# Patient Record
Sex: Female | Born: 2000 | Smoking: Never smoker
Health system: Southern US, Community
[De-identification: ages and names within clinical notes are randomized; demographics above are authoritative.]

## PROBLEM LIST (undated history)

## (undated) DIAGNOSIS — Z8489 Family history of other specified conditions: Secondary | ICD-10-CM

## (undated) DIAGNOSIS — H539 Unspecified visual disturbance: Secondary | ICD-10-CM

## (undated) DIAGNOSIS — S83519A Sprain of anterior cruciate ligament of unspecified knee, initial encounter: Secondary | ICD-10-CM

## (undated) DIAGNOSIS — S0990XA Unspecified injury of head, initial encounter: Secondary | ICD-10-CM

---
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
## 2019-09-23 ENCOUNTER — Other Ambulatory Visit: Payer: Self-pay

## 2019-09-23 ENCOUNTER — Ambulatory Visit (INDEPENDENT_AMBULATORY_CARE_PROVIDER_SITE_OTHER): Payer: Medicaid Other | Admitting: Orthopedic Surgery

## 2019-09-23 ENCOUNTER — Ambulatory Visit: Payer: Self-pay

## 2019-09-23 DIAGNOSIS — M25561 Pain in right knee: Secondary | ICD-10-CM | POA: Diagnosis not present

## 2019-09-23 IMAGING — MR MR KNEE*R* W/O CM
4 of 6 series · 19 of 40 positions shown · non-contrast
Comparison: Right knee radiographs 08/31/2017

CLINICAL DATA: Injured knee playing basketball 3 weeks ago.
Persistent knee pain and swelling. Instability.

EXAM:
MRI OF THE RIGHT KNEE WITHOUT CONTRAST
TECHNIQUE: Multiplanar, multisequence MR imaging of the knee was performed. No
intravenous contrast was administered.

[Series 3: PD fat-sat · axial · 4.0mm · 0.31mm/px · z∈[-39,+66]mm · 8 of 25 slices shown (1 of 4)]
[im 1/25]
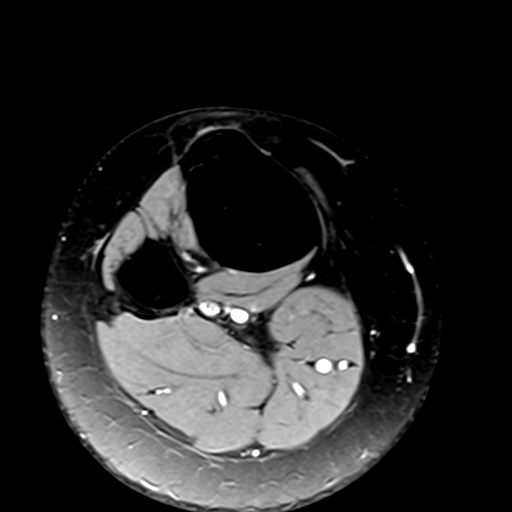
[im 4/25]
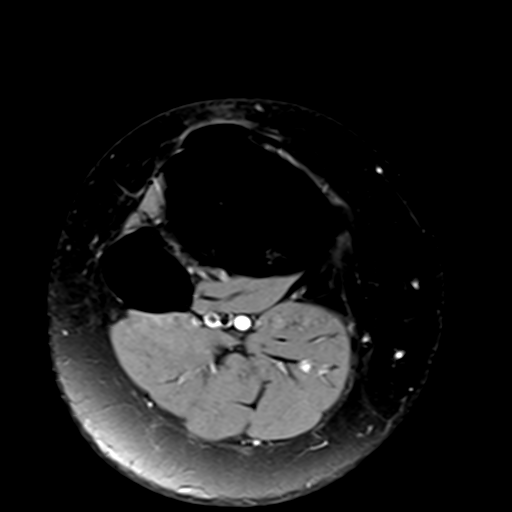
[im 7/25]
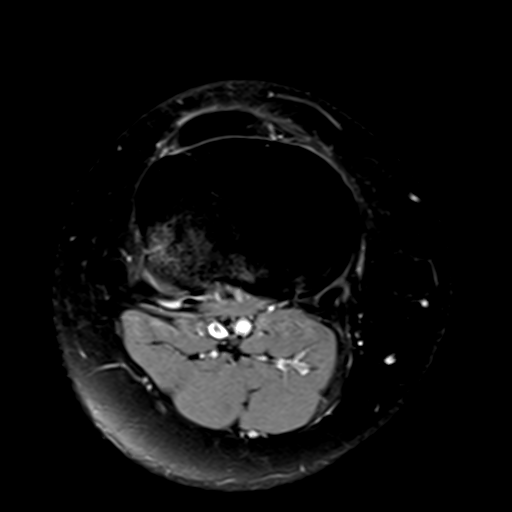
[im 11/25]
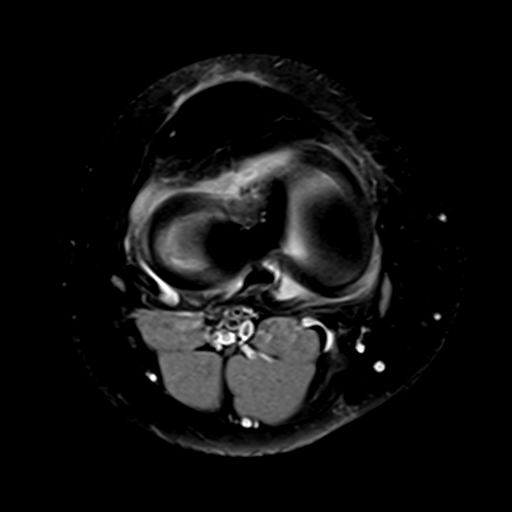
[im 14/25]
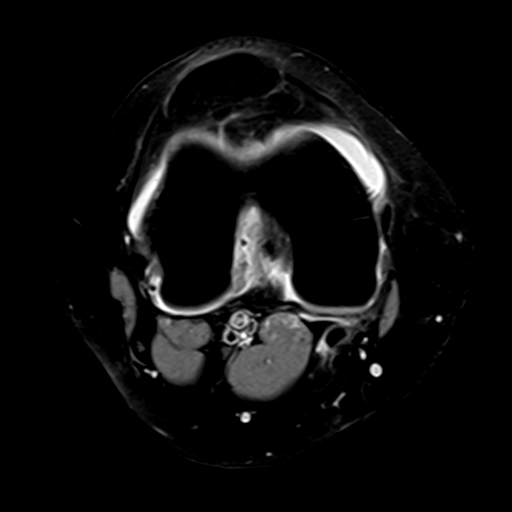
[im 18/25]
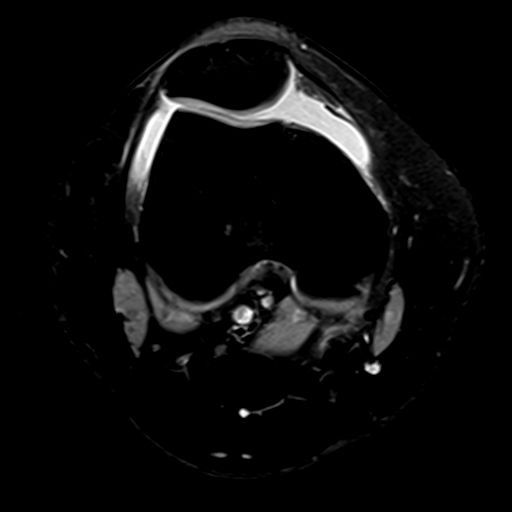
[im 21/25]
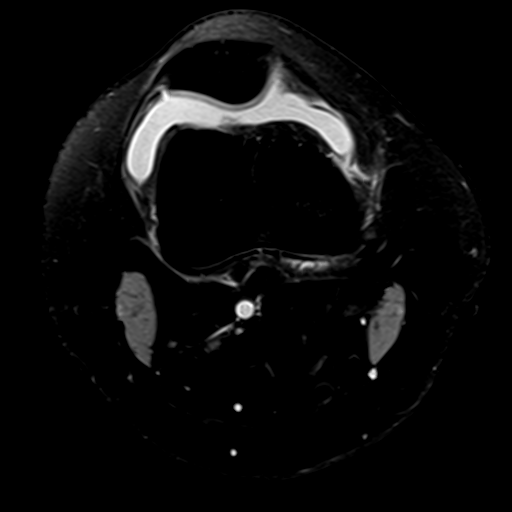
[im 25/25]
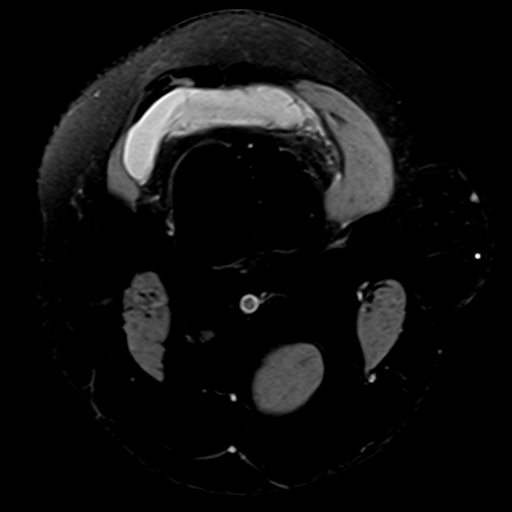

[Series 5: PD fat-sat · sagittal · 3.5mm · 0.31mm/px · 5 of 26 slices shown (2 of 4)]
[im 1/26]
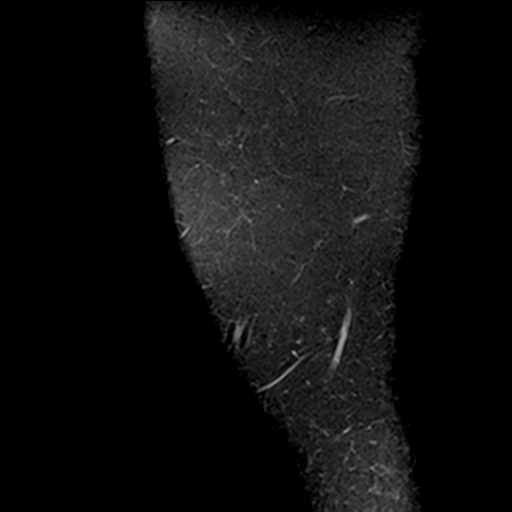
[im 4/26]
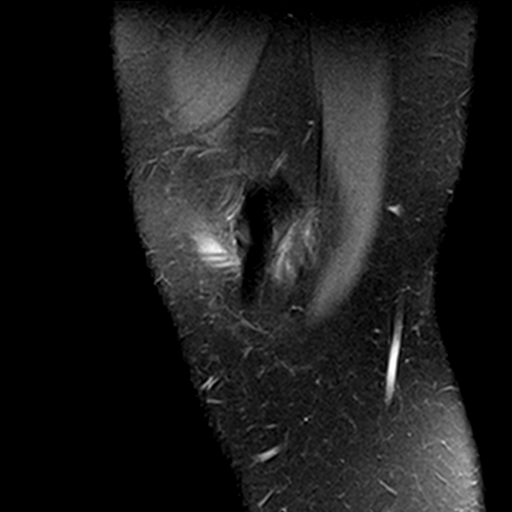
[im 8/26]
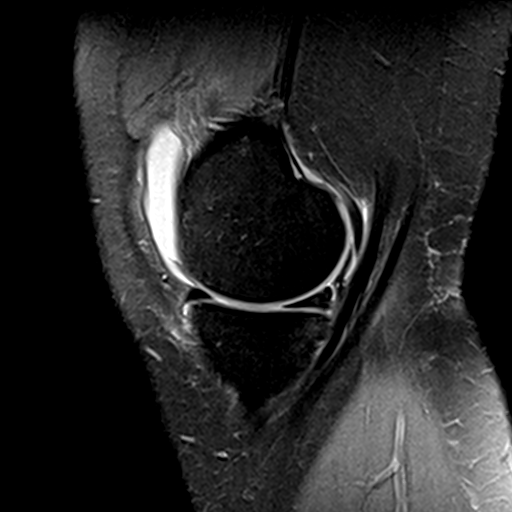
[im 15/26]
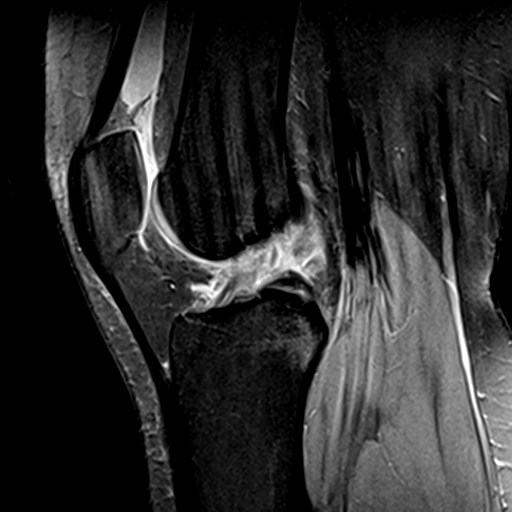
[im 22/26]
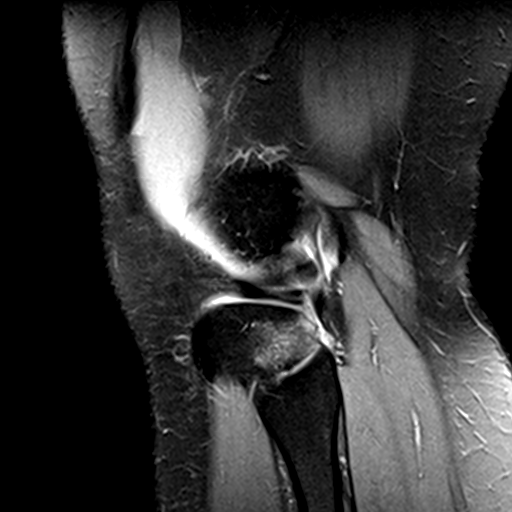

[Series 7: PD fat-sat · coronal · 3.5mm · 0.31mm/px · 3 of 25 slices shown (3 of 4)]
[im 5/25]
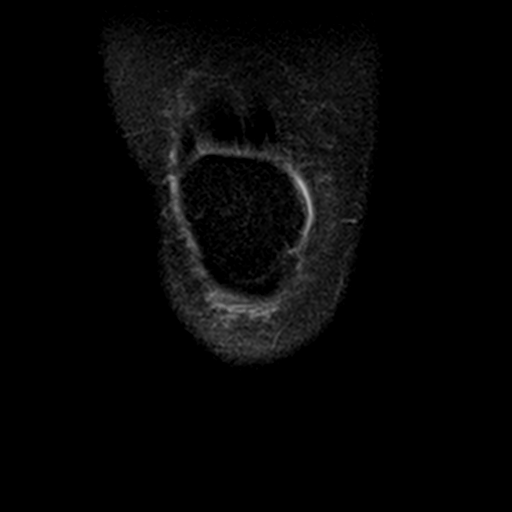
[im 13/25]
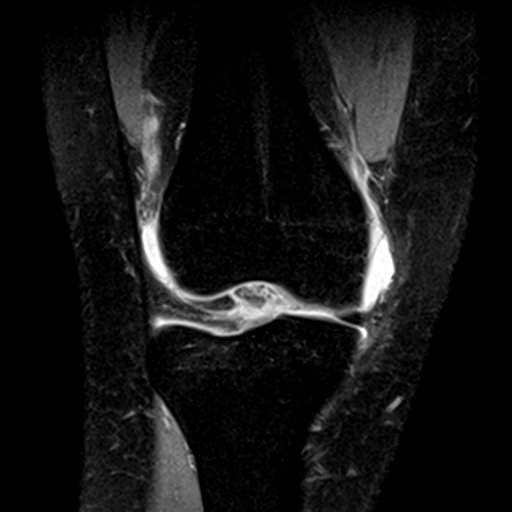
[im 21/25]
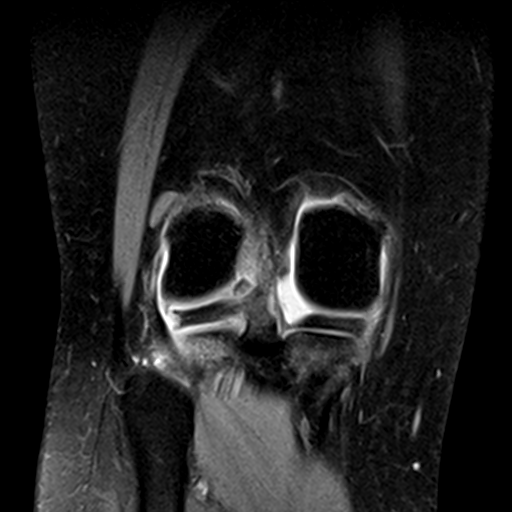

[Series 8: PD fat-sat · oblique · 2.0mm · 0.29mm/px · 3 of 11 slices shown (4 of 4)]
[im 1/11]
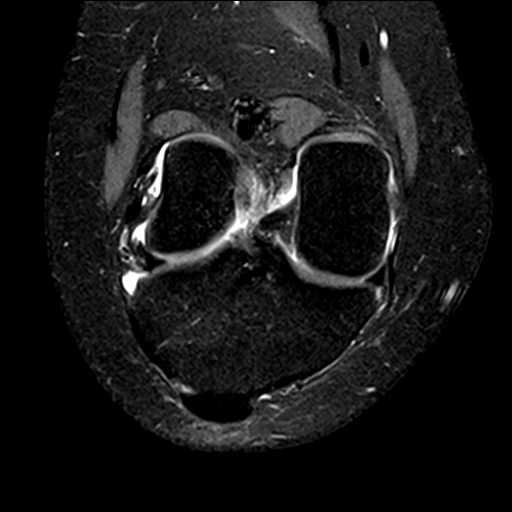
[im 6/11]
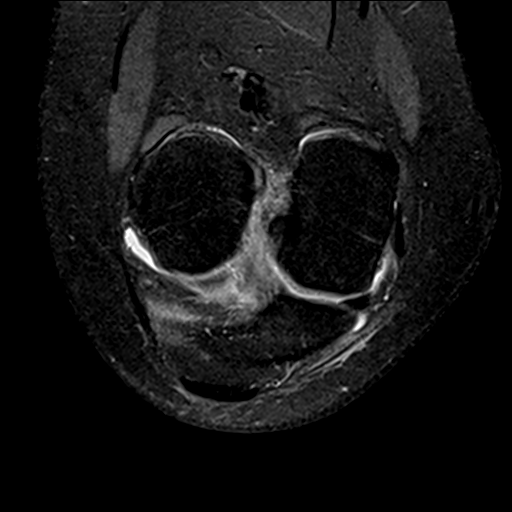
[im 11/11]
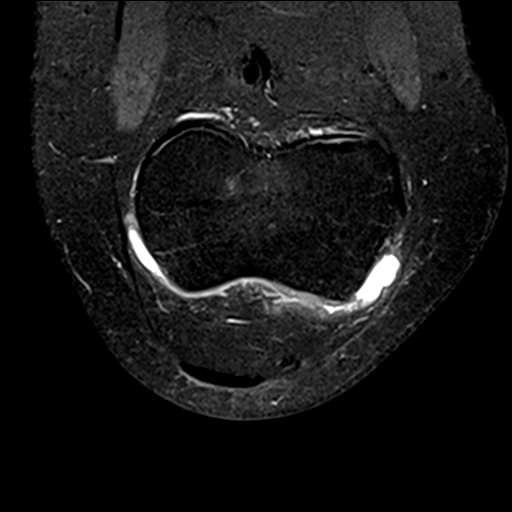

[19 of 40 positions shown; findings below may reference images not displayed]

FINDINGS: MENISCI

Medial meniscus: Meniscal contusion involving the posterior horn and
suspected meniscocapsular injury.

Lateral meniscus: Inferior articular surface tears, small surface
tears and horizontal cleavage-type tear involving the posterior
horn.

LIGAMENTS

Cruciates:  Complete midsubstance ACL rupture.  The PCL is intact.

Collaterals:  Intact

CARTILAGE

Patellofemoral:  Normal

Medial:  Normal

Lateral:  Normal

Joint:  Large joint effusion.  Superior and medial patellar plica.

Popliteal Fossa:  No popliteal mass.  Small Baker's cyst.

Extensor Mechanism: The patella retinacular structures are intact
and the quadriceps and patellar tendons are intact.

Bones:  Posterior tibial bone contusions without definite fracture.

Other: Normal knee musculature.
IMPRESSION: 1. Complete midsubstance ACL rupture and associated typical
pivot-shift bone contusions. The PCL and collateral ligaments are
intact.
2. Posterior horn lateral meniscal tears.
3. Meniscal contusion and suspected meniscocapsular injury involving
the posterior horn of the medial meniscus.
4. Large joint effusion with superior and medial patellar plica.
5. Intact articular cartilage.

## 2019-09-25 ENCOUNTER — Encounter: Payer: Self-pay | Admitting: Orthopedic Surgery

## 2019-09-25 NOTE — Progress Notes (Signed)
Office Visit Note   Patient: Kaitlyn Harris           Date of Birth: 2001-02-05           MRN: [id number] Visit Date: 09/23/2019 Requested by: Rubbie Battiest, NP [address],  [place] [address] PCP: Rubbie Battiest, NP  Subjective: Chief Complaint  Patient presents with  . Right Knee - Pain    HPI: Kaitlyn Harris is a 18 y.o. female who presents to the office complaining of right knee pain.  Patient returns to the office following right knee ACL reconstruction with hamstring autograft and meniscal debridement on 12/03/2017.  She notes 2 months of right knee pain that she localizes to the medial aspect of the right knee.  She states that it is not severe until she has to work where she works as a Multimedia programmer at [employer].  She notes that being on her feet standing for long periods of time makes her pain worse.  She has no pain today in the office.  She has been off work for several days.  She has been trying to use a brace without relief.  She denies any specific injury.  She denies any mechanical symptoms and has been using ibuprofen occasionally with some relief.  She has not been working out at the gym ever since her symptom onset.  She denies any significant back pain, weakness, numbness tingling, groin pain..                ROS:  All systems reviewed are negative as they relate to the chief complaint within the history of present illness.  Patient denies fevers or chills.  Assessment & Plan: Visit Diagnoses:  1. Right knee pain, unspecified chronicity     Plan: Patient is a 18 year old female who presents complaining of right knee pain.  Based on history and physical exam, impression is stress reaction.  X-rays of the right knee are negative for any significant pathology to explain her pain.  Will check vitamin D level today in clinic.  Will call with vitamin D level.  If it is low plan for vitamin D supplementation with return  office visit after several weeks.  However if her vitamin D is normal, will consider advanced imaging of the right knee.  Patient agrees with the plan.  Differential diagnosis would be stress fracture or stress reaction versus occult arthritis from meniscal resection.  Graft is stable no effusion in the knee.  Follow-Up Instructions: No follow-ups on file.   Orders:  Orders Placed This Encounter  Procedures  . XR KNEE 3 VIEW RIGHT  . Vitamin D (25 hydroxy)   No orders of the defined types were placed in this encounter.     Procedures: No procedures performed   Clinical Data: No additional findings.  Objective: Vital Signs: There were no vitals taken for this visit.  Physical Exam:  Constitutional: Patient appears well-developed HEENT:  Head: Normocephalic Eyes:EOM are normal Neck: Normal range of motion Cardiovascular: Normal rate Pulmonary/chest: Effort normal Neurologic: Patient is alert Skin: Skin is warm Psychiatric: Patient has normal mood and affect  Ortho Exam:  Right knee Exam No effusion Extensor mechanism intact No TTP over lateral jointlines, quad tendon, patellar tendon, pes anserinus, patella, tibial tubercle, LCL/MCL insertions TTP over the medial joint line Stable to varus/valgus stresses.  Stable to anterior/posterior drawer.  Stable to Lachman exam Extension to 0 degrees Flexion > 90 degrees  Specialty Comments:  No specialty comments available.  Imaging: No results found.   PMFS History: There are no active problems to display for this patient.  Past Medical History:  Diagnosis Date  . ACL tear    and meniscal tear, right knee  . Family history of adverse reaction to anesthesia    Pt maternal grandmother has PONV  . Head trauma   . Vision abnormalities    wears glasses    Family History  Problem Relation Age of Onset  . Diabetes Mother     Past Surgical History:  Procedure Laterality Date  . ANTERIOR CRUCIATE LIGAMENT REPAIR  Right 12/03/2017   Procedure: RIGHT KNEE RECONSTRUCTION ANTERIOR CRUCIATE LIGAMENT (ACL) WITH HAMSTRING AUTOGRAFT, AND MENISCAL DEBRIDEMENT;  Surgeon: Cammy Copa, MD;  Location: [facility] OR;  Service: Orthopedics;  Laterality: Right;   Social History   Occupational History  . Not on file  Tobacco Use  . Smoking status: Never Smoker  . Smokeless tobacco: Never Used  Substance and Sexual Activity  . Alcohol use: No  . Drug use: No  . Sexual activity: Never

## 2019-09-30 ENCOUNTER — Ambulatory Visit (INDEPENDENT_AMBULATORY_CARE_PROVIDER_SITE_OTHER): Payer: Medicaid Other | Admitting: Obstetrics and Gynecology

## 2019-09-30 ENCOUNTER — Other Ambulatory Visit: Payer: Self-pay

## 2019-09-30 ENCOUNTER — Encounter: Payer: Self-pay | Admitting: Obstetrics and Gynecology

## 2019-09-30 VITALS — BP 132/74 | HR 80 | Wt 173.0 lb

## 2019-09-30 DIAGNOSIS — Z3041 Encounter for surveillance of contraceptive pills: Secondary | ICD-10-CM | POA: Diagnosis not present

## 2019-09-30 DIAGNOSIS — Z6831 Body mass index (BMI) 31.0-31.9, adult: Secondary | ICD-10-CM | POA: Diagnosis not present

## 2019-09-30 DIAGNOSIS — F329 Major depressive disorder, single episode, unspecified: Secondary | ICD-10-CM

## 2019-09-30 DIAGNOSIS — N912 Amenorrhea, unspecified: Secondary | ICD-10-CM | POA: Diagnosis not present

## 2019-09-30 DIAGNOSIS — F32A Depression, unspecified: Secondary | ICD-10-CM

## 2019-09-30 DIAGNOSIS — N760 Acute vaginitis: Secondary | ICD-10-CM

## 2019-09-30 MED ORDER — LEVONORGESTREL-ETHINYL ESTRAD 0.15-30 MG-MCG PO TABS: 1.0000 | ORAL_TABLET | Freq: Every day | ORAL | 3 refills | Status: AC

## 2019-09-30 NOTE — Progress Notes (Signed)
Obstetrics and Gynecology New Patient Evaluation  Appointment Date: 09/30/2019  OBGYN Clinic: [facility]  Primary Care Provider: Dorinda Harris  Referring Provider: [facility]  Chief Complaint: referral for amenorrhea  History of Present Illness: Kaitlyn Harris is a 18 y.o. African-American P0 (LMP: just started), seen for the above chief complaint. Her past medical history is significant for BMI 30   Patient seen by [facility] in July 2020 for amenorrhea x 6 months and started on OCPs; she had neg UPT, gc/ct/trich at that visit. See below  Period just started. ?smell to urine or discharge.   Review of Systems:  as noted in the History of Present Illness.  Past Medical History:  Past Medical History:  Diagnosis Date  . ACL tear    and meniscal tear, right knee  . Family history of adverse reaction to anesthesia    Pt maternal grandmother has PONV  . Head trauma   . Vision abnormalities    wears glasses    Past Surgical History:  Past Surgical History:  Procedure Laterality Date  . ANTERIOR CRUCIATE LIGAMENT REPAIR Right 12/03/2017   Procedure: RIGHT KNEE RECONSTRUCTION ANTERIOR CRUCIATE LIGAMENT (ACL) WITH HAMSTRING AUTOGRAFT, AND MENISCAL DEBRIDEMENT;  Surgeon: Kaitlyn Copa, MD;  Location: [facility] OR;  Service: Orthopedics;  Laterality: Right;    Past Obstetrical History:  OB History  Gravida Para Term Preterm AB Living  0 0 0 0 0 0  SAB TAB Ectopic Multiple Live Births  0 0 0 0 0    Past Gynecological History: As per HPI. Menarche age 67 y/o Periods: always irregular. Usually was every other month but for about a year prior to early 2020 it started to get progressively more spaced out to where she might go a few months before a period. Her periods have always been about 5 days, not too heavy or painful. Since being on OCPs she has regular periods when she's supposed to but her periods do feel a little more heavy and crampy, total length is  about 5 days History of Pap Smear(s): No History of STI(s): No. She is currently using oral contraceptives (estrogen/progesterone) for contraception.   Social History:  Social History   Socioeconomic History  . Marital status: Unknown    Spouse name: Not on file  . Number of children: Not on file  . Years of education: Not on file  . Highest education level: Not on file  Occupational History  . Not on file  Social Needs  . Financial resource strain: Not on file  . Food insecurity    Worry: Not on file    Inability: Not on file  . Transportation needs    Medical: Not on file    Non-medical: Not on file  Tobacco Use  . Smoking status: Never Smoker  . Smokeless tobacco: Never Used  Substance and Sexual Activity  . Alcohol use: No  . Drug use: No  . Sexual activity: Never  Lifestyle  . Physical activity    Days per week: Not on file    Minutes per session: Not on file  . Stress: Not on file  Relationships  . Social Musician on phone: Not on file    Gets together: Not on file    Attends religious service: Not on file    Active member of club or organization: Not on file    Attends meetings of clubs or organizations: Not on file    Relationship status: Not  on file  . Intimate partner violence    Fear of current or ex partner: Not on file    Emotionally abused: Not on file    Physically abused: Not on file    Forced sexual activity: Not on file  Other Topics Concern  . Not on file  Social History Narrative   Pt is in 12 th grade 2018-19. Lives at home with mother, siblings and step-father.    Family History:  Family History  Problem Relation Age of Onset  . Diabetes Mother     Medications Kaitlyn Harris had no medications administered during this visit. Current Outpatient Medications  Medication Sig Dispense Refill  . levonorgestrel-ethinyl estradiol (NORDETTE) 0.15-30 MG-MCG tablet Take 1 tablet by mouth daily.    Marland Kitchen ibuprofen (ADVIL,MOTRIN)  200 MG tablet Take 400 mg by mouth every 8 (eight) hours as needed for cramping.     No current facility-administered medications for this visit.     Allergies Patient has no known allergies.   Physical Exam:  BP 132/74   Pulse 80   Wt 173 lb (78.5 kg)  There is no height or weight on file to calculate BMI. General appearance: Well nourished, well developed female in no acute distress.  Neuro/Psych:  Normal mood and affect.   Laboratory: as per hpi  Radiology: none  Assessment: pt stable  Plan:  1. Amenorrhea Will do PCOS labs just to see but d/w her that still would recommend OCPs. D/w pt trying a different cyclical or continuous type OCPs. Pt to consider  Pt amenable to STI serum testing. D/w her re: abstinence and barrier  2. Encounter for surveillance of contraceptive pills - HIV antibody (with reflex) - Hepatitis B Surface AntiGEN - Hepatitis C Antibody - RPR  3. Depression, unspecified depression type Contracts for safety. Sounds, unfortunately, situational to COVID. Pt declines intervention but amenable to seeing Kaitlyn Harris when she comes back for her self swab - Ambulatory referral to [facility]  4. Acute vaginitis Declines exam today b/c on menses. Will have her come back for self swab - Cervicovaginal ancillary only( [place]); Future  Orders Placed This Encounter  Procedures  . HIV antibody (with reflex)  . Hepatitis B Surface AntiGEN  . Hepatitis C Antibody  . RPR  . TSH+Prl+TestT+TestF+17OHP  . Hemoglobin A1c  . DHEA-sulfate  . Ambulatory referral to [place]    RTC 7-10d self swab and to see Kaitlyn Maizes MD Attending [facility])

## 2019-09-30 NOTE — Progress Notes (Signed)
Went to the health department in June  go pills and her cycles started up again. Here today to know why her cycles were like that.

## 2019-10-01 LAB — DHEA-SULFATE: DHEA-SO4: 193 ug/dL (ref 110.0–433.2)

## 2019-10-04 ENCOUNTER — Encounter: Payer: Self-pay | Admitting: Orthopedic Surgery

## 2019-10-04 LAB — RPR: RPR Ser Ql: NONREACTIVE

## 2019-10-04 LAB — HEMOGLOBIN A1C
Est. average glucose Bld gHb Est-mCnc: 88 mg/dL
Hgb A1c MFr Bld: 4.7 % — ABNORMAL LOW (ref 4.8–5.6)

## 2019-10-04 LAB — HIV ANTIBODY (ROUTINE TESTING W REFLEX): HIV Screen 4th Generation wRfx: NONREACTIVE

## 2019-10-04 LAB — TSH+PRL+TESTT+TESTF+17OHP
17-Hydroxyprogesterone: 40 ng/dL
Prolactin: 13.9 ng/mL (ref 4.8–23.3)
TSH: 0.882 u[IU]/mL (ref 0.450–4.500)
Testosterone, Free: 0.7 pg/mL
Testosterone, Total, LC/MS: 22.2 ng/dL (ref 10.0–55.0)

## 2019-10-04 LAB — HEPATITIS C ANTIBODY: Hep C Virus Ab: <0.1 s/co ratio (ref 0.0–0.9)

## 2019-10-04 LAB — HEPATITIS B SURFACE ANTIGEN: Hepatitis B Surface Ag: NEGATIVE

## 2019-10-07 ENCOUNTER — Ambulatory Visit: Payer: Medicaid Other

## 2019-10-07 NOTE — BH Specialist Note (Signed)
Pt did not arrive to video visit and did not answer the phone ; Left HIPPA-compliant message to call back [name] from  at [phone].    Integrated Behavioral Health via Telemedicine Video Visit  10/07/2019 Kaitlyn Harris [id number]   Garlan Fair  Depression screen [facility] 2/9 09/30/2019  Decreased Interest 1  Down, Depressed, Hopeless 1  PHQ - 2 Score 2  Altered sleeping 0  Tired, decreased energy 1  Change in appetite 1  Feeling bad or failure about yourself  1  Trouble concentrating 0  Moving slowly or fidgety/restless 0  Suicidal thoughts 1  PHQ-9 Score 6

## 2019-10-12 ENCOUNTER — Other Ambulatory Visit: Payer: Self-pay

## 2019-10-12 ENCOUNTER — Ambulatory Visit: Payer: Medicaid Other | Admitting: Clinical

## 2019-10-12 DIAGNOSIS — Z91199 Patient's noncompliance with other medical treatment and regimen due to unspecified reason: Secondary | ICD-10-CM

## 2019-10-12 DIAGNOSIS — Z5329 Procedure and treatment not carried out because of patient's decision for other reasons: Secondary | ICD-10-CM

## 2019-11-02 ENCOUNTER — Telehealth: Payer: Self-pay | Admitting: Obstetrics and Gynecology

## 2019-11-02 NOTE — Telephone Encounter (Signed)
Patient is request a nurse call her back about have BV, and getting treated.

## 2019-11-03 ENCOUNTER — Ambulatory Visit (INDEPENDENT_AMBULATORY_CARE_PROVIDER_SITE_OTHER): Payer: Medicaid Other | Admitting: General Practice

## 2019-11-03 ENCOUNTER — Other Ambulatory Visit (HOSPITAL_COMMUNITY)
Admission: RE | Admit: 2019-11-03 | Discharge: 2019-11-03 | Disposition: A | Discharge status: Home / Self Care | Payer: Medicaid Other | Source: Ambulatory Visit | Attending: Student | Admitting: Student

## 2019-11-03 ENCOUNTER — Other Ambulatory Visit: Payer: Self-pay

## 2019-11-03 ENCOUNTER — Telehealth: Payer: Self-pay | Admitting: Student

## 2019-11-03 DIAGNOSIS — N898 Other specified noninflammatory disorders of vagina: Secondary | ICD-10-CM

## 2019-11-03 MED ORDER — METRONIDAZOLE 500 MG PO TABS: 500.0000 mg | ORAL_TABLET | Freq: Two times a day (BID) | ORAL | 0 refills | Status: AC

## 2019-11-03 NOTE — Telephone Encounter (Signed)
Received a call from Ms. Tabb-Smith. She stated she was going to be late for her appointment. I informed her she has a 15 minute grace period, and did she think she would get here within that time. She said I don't know. I told her as long as she could get here within her 15 minutes, we would still see her. She said okay.

## 2019-11-03 NOTE — Progress Notes (Signed)
Patient presents to office today reporting vaginal odor, itching/burning, & discharge over the past week. She states it feels similar to when she has got BV in the past. She has tried cranberry pills & probiotics, which has helped minimally. Will send in Flagyl per protocol. Patient instructed in self swab & specimen collected. Discussed results will be back in 24-48 hours and available in mychart. Instructed we will reach out with any needed medications. Patient verbalized understanding.  Chase Caller RN BSN 11/03/19

## 2019-11-04 LAB — CERVICOVAGINAL ANCILLARY ONLY
Bacterial Vaginitis (gardnerella): POSITIVE — AB
Candida Glabrata: NEGATIVE
Candida Vaginitis: NEGATIVE
Comment: NEGATIVE
Comment: NEGATIVE
Comment: NEGATIVE
Comment: NEGATIVE
Trichomonas: NEGATIVE

## 2019-12-20 NOTE — Progress Notes (Signed)
Chart reviewed for nurse visit. Agree with plan of care.   [name], CNM 12/20/2019 7:48 AM   

## 2020-05-26 ENCOUNTER — Ambulatory Visit: Payer: Medicaid Other | Attending: Endocrinology

## 2020-05-26 ENCOUNTER — Other Ambulatory Visit: Payer: Self-pay

## 2020-05-26 DIAGNOSIS — M4180 Other forms of scoliosis, site unspecified: Secondary | ICD-10-CM | POA: Diagnosis present

## 2020-05-26 DIAGNOSIS — M545 Low back pain: Secondary | ICD-10-CM | POA: Diagnosis present

## 2020-05-26 DIAGNOSIS — G8929 Other chronic pain: Secondary | ICD-10-CM | POA: Insufficient documentation

## 2020-05-26 DIAGNOSIS — M6281 Muscle weakness (generalized): Secondary | ICD-10-CM | POA: Diagnosis present

## 2020-05-27 NOTE — Therapy (Signed)
[facility] Outpatient Rehabilitation [facility] [address] [place], [place], [address] Phone: [phone]   Fax:  [phone]  Physical Therapy Evaluation  Patient Details  Name: Skylyn Slezak MRN: [id number] Date of Birth: Jun 15, 2001 Referring Provider (PT): Felix Pacini, FNP   Encounter Date: 05/26/2020   PT End of Session - 05/27/20 1041    Visit Number 1    Number of Visits 4    Date for PT Re-Evaluation 07/15/20    Authorization Type [place] MEDICAID [employer]HEALTHC[employer] COMMUNITY    PT Start Time 1132    PT Stop Time 1222    PT Time Calculation (min) 50 min    Activity Tolerance Patient tolerated treatment well    Behavior During Therapy [facility] for tasks assessed/performed           Past Medical History:  Diagnosis Date  . ACL tear    and meniscal tear, right knee  . Family history of adverse reaction to anesthesia    Pt maternal grandmother has PONV  . Head trauma   . Vision abnormalities    wears glasses    Past Surgical History:  Procedure Laterality Date  . ANTERIOR CRUCIATE LIGAMENT REPAIR Right 12/03/2017   Procedure: RIGHT KNEE RECONSTRUCTION ANTERIOR CRUCIATE LIGAMENT (ACL) WITH HAMSTRING AUTOGRAFT, AND MENISCAL DEBRIDEMENT;  Surgeon: Cammy Copa, MD;  Location: [facility] OR;  Service: Orthopedics;  Laterality: Right;    There were no vitals filed for this visit.    Subjective Assessment - 05/27/20 1033    Subjective Pt reports her low back is giving her the most difficulty nd requested the PT eval be focused on this area. pt reports an old ACL injury of the r knee.    Limitations Lifting;Standing;House hold activities    Patient Stated Goals For low back pain to be decreased    Currently in Pain? Yes    Pain Score 5    0-7/10 pain range   Pain Location Back    Pain Orientation Posterior;Lower    Pain Descriptors / Indicators Aching    Pain Type Chronic pain    Pain Onset Other (comment)   for a few years   Pain Frequency  Intermittent    Aggravating Factors  Prolonged positions, bending alot, lifting    Pain Relieving Factors Chang positions, pain med    Effect of Pain on Daily Activities Able to complete activities, but has to manage her pain              [facility] PT Assessment - 05/27/20 0001      Assessment   Medical Diagnosis low back and knee pain    Referring Provider (PT) Felix Pacini, FNP    Onset Date/Surgical Date --   For a few years   Hand Dominance Right    Next MD Visit --   Not scheduled   Prior Therapy none for back, but for r knee      Precautions   Precautions None      Restrictions   Weight Bearing Restrictions No      Balance Screen   Has the patient fallen in the past 6 months No    Has the patient had a decrease in activity level because of a fear of falling?  No    Is the patient reluctant to leave their home because of a fear of falling?  No      Home [employer]ist information centre manager residence    Living Arrangements Spouse/significant  other    Type of Home Apartment    Home Access Stairs to enter    Entrance Stairs-Number of Steps 2 flights    Entrance Stairs-Rails Right;Left    Home Layout Multi-level    Alternate Level Stairs-Number of Steps 3 flights    Alternate Level Stairs-Rails Right;Left      Prior Function   Level of Independence Independent    Vocation Full time employment    Vocation Requirements Runs drive thru at [employer] Would like to return to the gym /exercise      Cognition   Overall Cognitive Status Within Functional Limits for tasks assessed      Observation/Other Assessments   Scoliosis left, 10d    Focus on Therapeutic Outcomes (FOTO)  NA      Sensation   Light Touch Appears Intact      Posture/Postural Control   Posture/Postural Control Postural limitations    Postural Limitations Rounded Shoulders;Forward head   Levoscoliosis     ROM / Strength   AROM / PROM / Strength AROM;Strength      AROM    Overall AROM Comments All trunk ROMs are WNLs; with return to standing from flex, ext, and l and R SB pt reported provacation of low back pain      Strength   Overall Strength Comments LS myptomal screen was negative      Palpation   Palpation comment TTP of low back paraspinals and PSIS bilaterally, R>L      Special Tests    Special Tests Sacrolliac Tests;Lumbar    Lumbar Tests Slump Test;Straight Leg Raise    Sacroiliac Tests  Pelvic Compression      Slump test   Findings Negative      Straight Leg Raise   Findings Negative    Side  Right   Lt     Pelvic Dictraction   Findings Negative      Pelvic Compression   Findings Negative      Transfers   Transfers Sit to Stand;Stand to Sit      Ambulation/Gait   Ambulation/Gait Yes    Gait Pattern Within Functional Limits;Step-through pattern                      Objective measurements completed on examination: See above findings.               PT Education - 05/27/20 1040    Education Details Eval finding, POC, HEP, positional changes for work and for sleeping to reduce and manage pain.    Person(s) Educated Patient    Methods Explanation;Demonstration;Tactile cues;Verbal cues;Handout    Comprehension Verbalized understanding;Returned demonstration;Tactile cues required;Verbal cues required;Need further instruction            PT Short Term Goals - 05/27/20 1122      PT SHORT TERM GOAL #1   Title Pt will be Ind in an initial HEP    Baseline Started on eval    Time 3    Period Weeks    Status New    Target Date 06/17/20      PT SHORT TERM GOAL #2   Title Pt will voice understanding/return demonstration of body mechanics to reduce back strain and pain    Time 3    Period Weeks    Status New    Target Date 06/17/20             PT Long Term Goals -  05/27/20 1126      PT LONG TERM GOAL #1   Title Pt will be in a final HEP    Time 7    Period Weeks    Status New    Target Date  07/15/20      PT LONG TERM GOAL #2   Title Pt will report an improved pain range of 0-3 c daily and work activities.    Baseline 0-7/10    Time 7    Period Weeks    Status New    Target Date 07/15/20      PT LONG TERM GOAL #3   Title Pt will demonstrate improve hip flexor ROM and abdominal strength    Time 7    Period Weeks    Status New    Target Date 07/15/20                  Plan - 05/27/20 1103    Clinical Impression Statement Pt presents with a history of chronic back pain associated c levoscoliosis and prolonged positioning in sitting and standing. Pt's trunk ROM is WNLs. Pt has increased lordosis, tight hip flexors and weak abdominals. Pt will benefit form PT 2w6 to address low back and hip flexibity and core/back strengthening, postural deficits, and body mechanics to decrease pain and function.    Comorbidities Levoscoliosis, ACL repair    Examination-Activity Limitations Squat;Stand;Lift;Sit    Examination-Participation Restrictions Occupation    Stability/Clinical Decision Making Stable/Uncomplicated    Clinical Decision Making Low    Rehab Potential Good    PT Frequency 2x / week    PT Duration 6 weeks    PT Treatment/Interventions ADLs/Self Care Home Management;Cryotherapy;Electrical Stimulation;Iontophoresis 4mg /ml Dexamethasone;Moist Heat;Traction;Ultrasound;Therapeutic exercise;Therapeutic activities;Functional mobility training;Patient/family education;Dry needling;Taping;Manual techniques;Passive range of motion    PT Next Visit Plan Assess response to HEP and pain management measures. Progress  ther ex and flexibility and strengthening as indicared. Instreucted in body mechanics    PT Home Exercise Plan [email or web address]    Consulted and Agree with Plan of Care Patient           Patient will benefit from skilled therapeutic intervention in order to improve the following deficits and impairments:  Decreased activity tolerance, Decreased strength, Postural  dysfunction, Improper body mechanics, Pain, Impaired flexibility  Visit Diagnosis: Muscle weakness (generalized) - Plan: PT plan of care cert/re-cert  Chronic bilateral low back pain without sciatica - Plan: PT plan of care cert/re-cert  Levoscoliosis - Plan: PT plan of care cert/re-cert     Problem List Patient Active Problem List   Diagnosis Date Noted  . Amenorrhea 09/30/2019  . Depression 09/30/2019    14/06/2019 MS, PT 05/27/20 11:34 AM  [facility] Outpatient Rehabilitation [facility] [address] [place], [place], [place] Phone: [phone]   Fax:  [phone]  Name: Curley Fayette MRN: [name] Date of Birth: 03-Aug-2001  Check all possible CPT codes:      [x]  97110 (Therapeutic Exercise)  []  92507 (SLP Treatment)  [x]  97112 (Neuro Re-ed)   []  92526 (Swallowing Treatment)   [x]  97116 (Gait Training)   []  [id number] (Cognitive Training, 1st 15 minutes) [x]  97140 (Manual Therapy)   []  97130 (Cognitive Training, each add'l 15 minutes)  [x]  97530 (Therapeutic Activities)  []  Other, List CPT Code ____________    []  97535 (Self Care)       []  All codes above (97110 - 97535)  [x]  97012 (Mechanical Traction)  [x]  97014 (E-stim Unattended)  [x]  97032 (  E-stim manual)  [x]  97033 (Ionto)  [x]  97035 (Ultrasound)  [x]  97016 (Vaso)  []  [phone] Furniture conservator/restorer(Orthotic Fit) []  [phone] (Prosthetic Training) []  [id number] (Physical Performance Training) []  [id number] (Aquatic Therapy) []  [phone] (Canalith Repositioning) []  [id number] (Contrast Bath) []  [id number] (Paraffin) []  97597 (Wound Care 1st 20 sq cm) []  97598 (Wound Care each add'l 20 sq cm)

## 2020-06-02 ENCOUNTER — Ambulatory Visit: Payer: Medicaid Other | Admitting: Physical Therapy

## 2020-06-07 ENCOUNTER — Ambulatory Visit: Payer: Medicaid Other

## 2020-06-09 ENCOUNTER — Ambulatory Visit: Payer: Medicaid Other

## 2020-06-09 ENCOUNTER — Telehealth: Payer: Self-pay

## 2020-06-09 NOTE — Telephone Encounter (Signed)
Called pt after she no-showed her 10:15am appointment. She did not answer and the phone only rang a few times. I was unable to leave a message to remind her of her next appointment on 06/14/20. I let FD know.   Bettey Mare. Corliss Marcus, PT, DPT

## 2020-06-14 ENCOUNTER — Ambulatory Visit: Payer: Medicaid Other | Admitting: Physical Therapy

## 2020-06-14 ENCOUNTER — Other Ambulatory Visit: Payer: Self-pay

## 2020-06-14 DIAGNOSIS — M6281 Muscle weakness (generalized): Secondary | ICD-10-CM | POA: Diagnosis not present

## 2020-06-14 DIAGNOSIS — G8929 Other chronic pain: Secondary | ICD-10-CM

## 2020-06-14 DIAGNOSIS — M4180 Other forms of scoliosis, site unspecified: Secondary | ICD-10-CM

## 2020-06-14 NOTE — Therapy (Signed)
[facility] Outpatient Rehabilitation [facility] [address] [place], [place], [address] Phone: [phone]   Fax:  [phone]  Physical Therapy Treatment  Patient Details  Name: Kaitlyn Harris MRN: [id number] Date of Birth: Nov 28, 2000 Referring Provider (PT): Kaitlyn Pacini, FNP   Encounter Date: 06/14/2020   PT End of Session - 06/14/20 1222    Visit Number 2    Number of Visits 4    Date for PT Re-Evaluation 07/15/20    Authorization Type [place] MEDICAID UNITEDHEALTHCARE COMMUNITY    PT Start Time 1145    PT Stop Time 1220    PT Time Calculation (min) 35 min    Activity Tolerance Patient tolerated treatment well    Behavior During Therapy [facility] for tasks assessed/performed           Past Medical History:  Diagnosis Date  . ACL tear    and meniscal tear, right knee  . Family history of adverse reaction to anesthesia    Pt maternal grandmother has PONV  . Head trauma   . Vision abnormalities    wears glasses    Past Surgical History:  Procedure Laterality Date  . ANTERIOR CRUCIATE LIGAMENT REPAIR Right 12/03/2017   Procedure: RIGHT KNEE RECONSTRUCTION ANTERIOR CRUCIATE LIGAMENT (ACL) WITH HAMSTRING AUTOGRAFT, AND MENISCAL DEBRIDEMENT;  Surgeon: Cammy Copa, MD;  Location: [facility] OR;  Service: Orthopedics;  Laterality: Right;    There were no vitals filed for this visit.   Subjective Assessment - 06/14/20 1147    Subjective Pt reports her low back started hurting while sitting on the bus on the way to PT. She states her back hurts the most when she's sitting in prolonged position.    Limitations Lifting;Standing;House hold activities    How long can you sit comfortably? 5 min    How long can you stand comfortably? A while if adjusting; a short time if static    How long can you walk comfortably? No limitation by back    Patient Stated Goals For low back pain to be decreased    Currently in Pain? Yes    Pain Score 4     Pain Location  Back    Pain Orientation Posterior;Lower    Pain Descriptors / Indicators Aching    Pain Onset Other (comment)   for a few years                            [facility] Adult PT Treatment/Exercise - 06/14/20 0001      Lumbar Exercises: Standing   Other Standing Lumbar Exercises PPT 2x10    Other Standing Lumbar Exercises wall squat with PPT 2 x10      Lumbar Exercises: Supine   Ab Set 5 reps    Pelvic Tilt 5 reps    Other Supine Lumbar Exercises Marching x10    Other Supine Lumbar Exercises Pball under ankle roll out 2x10      Lumbar Exercises: Prone   Opposite Arm/Leg Raise Right arm/Left leg;Left arm/Right leg;10 reps                  PT Education - 06/14/20 1221    Education Details Discussed finding "neutral spine" and back position that is most comfortable for her that reduces her lordosis.    Person(s) Educated Patient    Methods Explanation;Demonstration;Tactile cues;Verbal cues;Handout    Comprehension Verbalized understanding;Returned demonstration;Verbal cues required;Tactile cues required;Need further instruction  PT Short Term Goals - 05/27/20 1122      PT SHORT TERM GOAL #1   Title Pt will be Ind in an initial HEP    Baseline Started on eval    Time 3    Period Weeks    Status New    Target Date 06/17/20      PT SHORT TERM GOAL #2   Title Pt will voice understanding/return demonstration of body mechanics to reduce back strain and pain    Time 3    Period Weeks    Status New    Target Date 06/17/20             PT Long Term Goals - 05/27/20 1126      PT LONG TERM GOAL #1   Title Pt will be in a final HEP    Time 7    Period Weeks    Status New    Target Date 07/15/20      PT LONG TERM GOAL #2   Title Pt will report an improved pain range of 0-3 c daily and work activities.    Baseline 0-7/10    Time 7    Period Weeks    Status New    Target Date 07/15/20      PT LONG TERM GOAL #3   Title Pt will  demonstrate improve hip flexor ROM and abdominal strength    Time 7    Period Weeks    Status New    Target Date 07/15/20                 Plan - 06/14/20 1222    Clinical Impression Statement Treatment focused on increasing lumbar extensor strength/endurance, core strengthening, and hip flexor lengthening. Neuro re-ed performed for pt's posture in sitting and standing to reduce lumbar lordosis. D/C-ed single knee to chest stretch due to pt c/o burning.    Comorbidities Levoscoliosis, ACL repair    Examination-Activity Limitations Squat;Stand;Lift;Sit    Examination-Participation Restrictions Occupation    Stability/Clinical Decision Making Stable/Uncomplicated    Rehab Potential Good    PT Frequency 2x / week    PT Duration 6 weeks    PT Treatment/Interventions ADLs/Self Care Home Management;Cryotherapy;Electrical Stimulation;Iontophoresis 4mg /ml Dexamethasone;Moist Heat;Traction;Ultrasound;Therapeutic exercise;Therapeutic activities;Functional mobility training;Patient/family education;Dry needling;Taping;Manual techniques;Passive range of motion    PT Next Visit Plan Assess response to HEP and pain management measures. Progress  ther ex and flexibility and strengthening as indicared. Instreucted in body mechanics    PT Home Exercise Plan [email or web address]    Consulted and Agree with Plan of Care Patient           Patient will benefit from skilled therapeutic intervention in order to improve the following deficits and impairments:  Decreased activity tolerance, Decreased strength, Postural dysfunction, Improper body mechanics, Pain, Impaired flexibility  Visit Diagnosis: Muscle weakness (generalized)  Chronic bilateral low back pain without sciatica  Levoscoliosis     Problem List Patient Active Problem List   Diagnosis Date Noted  . Amenorrhea 09/30/2019  . Depression 09/30/2019    [facility] [address] PT, DPT 06/14/2020, 12:26 PM  [facility] [address] [place], [place], [place] Phone: [phone]   Fax:  [phone]  Name: Kaitlyn Harris MRN: [name] Date of Birth: 06-29-2001

## 2020-06-16 ENCOUNTER — Ambulatory Visit: Payer: Medicaid Other | Admitting: Physical Therapy

## 2020-06-21 ENCOUNTER — Other Ambulatory Visit: Payer: Self-pay

## 2020-06-21 ENCOUNTER — Ambulatory Visit: Payer: Medicaid Other | Admitting: Physical Therapy

## 2020-06-21 DIAGNOSIS — M6281 Muscle weakness (generalized): Secondary | ICD-10-CM

## 2020-06-21 DIAGNOSIS — M4180 Other forms of scoliosis, site unspecified: Secondary | ICD-10-CM

## 2020-06-21 DIAGNOSIS — M545 Low back pain, unspecified: Secondary | ICD-10-CM

## 2020-06-21 NOTE — Therapy (Signed)
[facility] Outpatient Rehabilitation [facility] [address] [place], [place], [address] Phone: [phone]   Fax:  [phone]  Physical Therapy Treatment  Patient Details  Name: Kaitlyn Harris MRN: [id number] Date of Birth: Jan 11, 2001 Referring Provider (PT): Felix Pacini, FNP   Encounter Date: 06/21/2020   PT End of Session - 06/21/20 1212    Visit Number 3    Number of Visits 4    Date for PT Re-Evaluation 07/15/20    Authorization Type [place] MEDICAID UNITEDHEALTHCARE COMMUNITY    PT Start Time 1136    PT Stop Time 1219    PT Time Calculation (min) 43 min    Activity Tolerance Patient tolerated treatment well    Behavior During Therapy [facility] for tasks assessed/performed           Past Medical History:  Diagnosis Date  . ACL tear    and meniscal tear, right knee  . Family history of adverse reaction to anesthesia    Pt maternal grandmother has PONV  . Head trauma   . Vision abnormalities    wears glasses    Past Surgical History:  Procedure Laterality Date  . ANTERIOR CRUCIATE LIGAMENT REPAIR Right 12/03/2017   Procedure: RIGHT KNEE RECONSTRUCTION ANTERIOR CRUCIATE LIGAMENT (ACL) WITH HAMSTRING AUTOGRAFT, AND MENISCAL DEBRIDEMENT;  Surgeon: Cammy Copa, MD;  Location: [facility] OR;  Service: Orthopedics;  Laterality: Right;    There were no vitals filed for this visit.   Subjective Assessment - 06/21/20 1141    Subjective Pt states she's doing okay. Pt reports she's been working on her standing posture and she's been feeling a difference. Pt states she sleeps in her car at night which she feels undos her progress.    Limitations Lifting;Standing;House hold activities    How long can you sit comfortably? 5 min    How long can you stand comfortably? A while if adjusting; a short time if static    How long can you walk comfortably? No limitation by back    Patient Stated Goals For low back pain to be decreased    Currently in Pain? Yes     Pain Score 4     Pain Location Back    Pain Orientation Posterior;Lower    Pain Onset Other (comment)   for a few years                            [facility] Adult PT Treatment/Exercise - 06/21/20 0001      Lumbar Exercises: Stretches   Other Lumbar Stretch Exercise standing hip flexor x30 sec    Other Lumbar Stretch Exercise ITB stretch x30 sec      Lumbar Exercises: Standing   Other Standing Lumbar Exercises palloff press 2x10 blue tband, diagonal chops 2x10 blue tband    Other Standing Lumbar Exercises wall squat with PPT 2 x10      Lumbar Exercises: Supine   Pelvic Tilt 10 reps    Other Supine Lumbar Exercises double knee to chest marching down 2x10, double knee bend to straight x10      Lumbar Exercises: Prone   Opposite Arm/Leg Raise Right arm/Left leg;Left arm/Right leg;10 reps    Other Prone Lumbar Exercises Prone bilateral UE & LE lift x10      Modalities   Modalities Moist Heat      Moist Heat Therapy   Number Minutes Moist Heat 10 Minutes    Moist Heat Location Lumbar  Spine                    PT Short Term Goals - 05/27/20 1122      PT SHORT TERM GOAL #1   Title Pt will be Ind in an initial HEP    Baseline Started on eval    Time 3    Period Weeks    Status New    Target Date 06/17/20      PT SHORT TERM GOAL #2   Title Pt will voice understanding/return demonstration of body mechanics to reduce back strain and pain    Time 3    Period Weeks    Status New    Target Date 06/17/20             PT Long Term Goals - 05/27/20 1126      PT LONG TERM GOAL #1   Title Pt will be in a final HEP    Time 7    Period Weeks    Status New    Target Date 07/15/20      PT LONG TERM GOAL #2   Title Pt will report an improved pain range of 0-3 c daily and work activities.    Baseline 0-7/10    Time 7    Period Weeks    Status New    Target Date 07/15/20      PT LONG TERM GOAL #3   Title Pt will demonstrate improve hip flexor ROM  and abdominal strength    Time 7    Period Weeks    Status New    Target Date 07/15/20                 Plan - 06/21/20 1203    Clinical Impression Statement Pt tolerated treatment session well. Treatment focused on increasing core and lumbar extensor strength. Pt with limited core endurance and began to have reduced form for neutral spine in standing by end of session. Provided heat for heat management.    Comorbidities Levoscoliosis, ACL repair    Examination-Activity Limitations Squat;Stand;Lift;Sit    Examination-Participation Restrictions Occupation    Stability/Clinical Decision Making Stable/Uncomplicated    Rehab Potential Good    PT Frequency 2x / week    PT Duration 6 weeks    PT Treatment/Interventions ADLs/Self Care Home Management;Cryotherapy;Electrical Stimulation;Iontophoresis 4mg /ml Dexamethasone;Moist Heat;Traction;Ultrasound;Therapeutic exercise;Therapeutic activities;Functional mobility training;Patient/family education;Dry needling;Taping;Manual techniques;Passive range of motion    PT Next Visit Plan Assess response to HEP and pain management measures. Progress  ther ex and flexibility and strengthening as indicared. Instreucted in body mechanics    PT Home Exercise Plan [email or web address]    Consulted and Agree with Plan of Care Patient           Patient will benefit from skilled therapeutic intervention in order to improve the following deficits and impairments:  Decreased activity tolerance, Decreased strength, Postural dysfunction, Improper body mechanics, Pain, Impaired flexibility  Visit Diagnosis: Muscle weakness (generalized)  Chronic bilateral low back pain without sciatica  Levoscoliosis     Problem List Patient Active Problem List   Diagnosis Date Noted  . Amenorrhea 09/30/2019  . Depression 09/30/2019    [facility] April Ma L Juda Lajeunesse PT, DPT 06/21/2020, 12:26 PM  [facility] [address] [place], [place], [place] Phone: [phone]   Fax:  [phone]  Name: Kaitlyn Harris MRN: [name] Date of Birth: Apr 18, 2001

## 2020-06-23 ENCOUNTER — Ambulatory Visit: Payer: Medicaid Other | Attending: Endocrinology | Admitting: Physical Therapy

## 2020-06-23 ENCOUNTER — Encounter: Payer: Self-pay | Admitting: Physical Therapy

## 2020-06-23 ENCOUNTER — Other Ambulatory Visit: Payer: Self-pay

## 2020-06-23 DIAGNOSIS — M4180 Other forms of scoliosis, site unspecified: Secondary | ICD-10-CM | POA: Insufficient documentation

## 2020-06-23 DIAGNOSIS — G8929 Other chronic pain: Secondary | ICD-10-CM | POA: Diagnosis present

## 2020-06-23 DIAGNOSIS — M6281 Muscle weakness (generalized): Secondary | ICD-10-CM | POA: Diagnosis present

## 2020-06-23 DIAGNOSIS — M545 Low back pain, unspecified: Secondary | ICD-10-CM

## 2020-06-23 NOTE — Therapy (Signed)
[facility] [address] [place], [place], [address] Phone: [phone]   Fax:  [phone]  Physical Therapy Treatment  Patient Details  Name: Kaitlyn Harris MRN: [id number] Date of Birth: December 05, 2000 Referring Provider (PT): Kaitlyn Pacini, FNP   Encounter Date: 06/23/2020   PT End of Session - 06/23/20 1140    Visit Number 4    Number of Visits 4    Date for PT Re-Evaluation 07/15/20    Authorization Type [place] MEDICAID [employer]    PT Start Time 1140    PT Stop Time 1218    PT Time Calculation (min) 38 min    Activity Tolerance Patient tolerated treatment well    Behavior During Therapy [facility] for tasks assessed/performed           Past Medical History:  Diagnosis Date  . ACL tear    and meniscal tear, right knee  . Family history of adverse reaction to anesthesia    Pt maternal grandmother has PONV  . Head trauma   . Vision abnormalities    wears glasses    Past Surgical History:  Procedure Laterality Date  . ANTERIOR CRUCIATE LIGAMENT REPAIR Right 12/03/2017   Procedure: RIGHT KNEE RECONSTRUCTION ANTERIOR CRUCIATE LIGAMENT (ACL) WITH HAMSTRING AUTOGRAFT, AND MENISCAL DEBRIDEMENT;  Surgeon: Cammy Copa, MD;  [facility];  Service: Orthopedics;  Laterality: Right;    There were no vitals filed for this visit.   Subjective Assessment - 06/23/20 1144    Subjective Pt reports she's been feeling less stiff coming out of the car. Pt notes she's been doing all the stretches; however, one stretch was giving her knee an issue.    Limitations Lifting;Standing;House hold activities    How long can you sit comfortably? 5 min    How long can you stand comfortably? A while if adjusting; a short time if static    How long can you walk comfortably? No limitation by back    Patient Stated Goals For low back pain to be decreased    Currently in Pain? No/denies    Pain Onset Other (comment)    for a few years                            [facility] Adult PT Treatment/Exercise - 06/23/20 0001      Lumbar Exercises: Stretches   Hip Flexor Stretch Left;Right;30 seconds   Modified thomas stretch     Lumbar Exercises: Aerobic   Stationary Bike L1 x 4 min      Lumbar Exercises: Standing   Other Standing Lumbar Exercises functional squat with 15# x10; squats x 10    Other Standing Lumbar Exercises hip extension 2x10 green tband      Lumbar Exercises: Supine   Other Supine Lumbar Exercises double knee to chest marching down 2x10, double knee bend to straight x10      Lumbar Exercises: Prone   Other Prone Lumbar Exercises Prone bilateral UE & LE lift 2x10    Other Prone Lumbar Exercises Plank 3x20 sec, side plank 2x20 sec                    PT Short Term Goals - 06/23/20 1206      PT SHORT TERM GOAL #1   Title Pt will be Ind in an initial HEP    Baseline Started on eval    Time 3  Period Weeks    Status Achieved    Target Date 06/17/20      PT SHORT TERM GOAL #2   Title Pt will voice understanding/return demonstration of body mechanics to reduce back strain and pain    Time 3    Period Weeks    Status Achieved    Target Date 06/17/20             PT Long Term Goals - 05/27/20 1126      PT LONG TERM GOAL #1   Title Pt will be in a final HEP    Time 7    Period Weeks    Status New    Target Date 07/15/20      PT LONG TERM GOAL #2   Title Pt will report an improved pain range of 0-3 c daily and work activities.    Baseline 0-7/10    Time 7    Period Weeks    Status New    Target Date 07/15/20      PT LONG TERM GOAL #3   Title Pt will demonstrate improve hip flexor ROM and abdominal strength    Time 7    Period Weeks    Status New    Target Date 07/15/20                 Plan - 06/23/20 1156    Clinical Impression Statement Pt tolerated treatment without any issue. Continued core and trunk strengthening/stabilization.  Pt reports decreased tightness over all. Initiated planking and standing hip exercises including squat with weight and tband.    Comorbidities Levoscoliosis, ACL repair    Examination-Activity Limitations Squat;Stand;Lift;Sit    Examination-Participation Restrictions Occupation    Stability/Clinical Decision Making Stable/Uncomplicated    Rehab Potential Good    PT Frequency 2x / week    PT Duration 6 weeks    PT Treatment/Interventions ADLs/Self Care Home Management;Cryotherapy;Electrical Stimulation;Iontophoresis 4mg /ml Dexamethasone;Moist Heat;Traction;Ultrasound;Therapeutic exercise;Therapeutic activities;Functional mobility training;Patient/family education;Dry needling;Taping;Manual techniques;Passive range of motion    PT Next Visit Plan Assess response to HEP and pain management measures. Progress  ther ex and flexibility and strengthening as indicared. Instreucted in body mechanics    PT Home Exercise Plan [id number]; planking    Consulted and Agree with Plan of Care Patient           Patient will benefit from skilled therapeutic intervention in order to improve the following deficits and impairments:  Decreased activity tolerance, Decreased strength, Postural dysfunction, Improper body mechanics, Pain, Impaired flexibility  Visit Diagnosis: Muscle weakness (generalized)  Chronic bilateral low back pain without sciatica  Levoscoliosis     Problem List Patient Active Problem List   Diagnosis Date Noted  . Amenorrhea 09/30/2019  . Depression 09/30/2019    [facility] April Ma L Aylene Acoff PT, DPT 06/23/2020, 12:21 PM  [facility] [address] [place], [place], [place] Phone: [phone]   Fax:  [phone]  Name: Kaitlyn Harris MRN: [name] Date of Birth: 10/14/2001

## 2020-06-28 ENCOUNTER — Ambulatory Visit: Payer: Medicaid Other | Admitting: Physical Therapy

## 2020-06-30 ENCOUNTER — Encounter: Payer: Self-pay | Admitting: Physical Therapy

## 2020-06-30 ENCOUNTER — Ambulatory Visit: Payer: Medicaid Other | Admitting: Physical Therapy

## 2020-06-30 ENCOUNTER — Other Ambulatory Visit: Payer: Self-pay

## 2020-06-30 DIAGNOSIS — M6281 Muscle weakness (generalized): Secondary | ICD-10-CM

## 2020-06-30 DIAGNOSIS — M4180 Other forms of scoliosis, site unspecified: Secondary | ICD-10-CM

## 2020-06-30 DIAGNOSIS — M545 Low back pain, unspecified: Secondary | ICD-10-CM

## 2020-06-30 NOTE — Therapy (Signed)
[place], [place], [address] Phone: [phone]   Fax:  [phone]  Physical Therapy Treatment  Patient Details  Name: Kaitlyn Harris MRN: [id number] Date of Birth: 2001/08/30 Referring Provider (PT): Beverley Fiedler, FNP   Encounter Date: 06/30/2020   PT End of Session - 06/30/20 1054    Visit Number 5    Date for PT Re-Evaluation 07/15/20    Authorization Type [place] MEDICAID [employer]UN[employer]    PT Start Time 1047    PT Stop Time 1130    PT Time Calculation (min) 43 min    Activity Tolerance Patient tolerated treatment well    Behavior During Therapy [facility] for tasks assessed/performed           Past Medical History:  Diagnosis Date  . ACL tear    and meniscal tear, right knee  . Family history of adverse reaction to anesthesia    Pt maternal grandmother has PONV  . Head trauma   . Vision abnormalities    wears glasses    Past Surgical History:  Procedure Laterality Date  . ANTERIOR CRUCIATE LIGAMENT REPAIR Right 12/03/2017   Procedure: RIGHT KNEE RECONSTRUCTION ANTERIOR CRUCIATE LIGAMENT (ACL) WITH HAMSTRING AUTOGRAFT, AND MENISCAL DEBRIDEMENT;  Surgeon: Meredith Pel, MD;  Location: [place];  Service: Orthopedics;  Laterality: Right;    There were no vitals filed for this visit.   Subjective Assessment - 06/30/20 1051    Subjective Pt states she's been doing good. She reports she's starting her work orientation today and starting her new job so she isn't sure if she will be able to make PT appointments. Pt states her back has been feeling good.    Limitations Lifting;Standing;House hold activities    How long can you sit comfortably? 5 min    How long can you stand comfortably? A while if adjusting; a short time if static    How long can you walk comfortably? No limitation by back    Patient Stated Goals For low back pain to be decreased    Currently in Pain? Yes     Pain Score 4     Pain Location Back    Pain Descriptors / Indicators Aching    Pain Onset Other (comment)   for a few years                            [facility] Adult PT Treatment/Exercise - 06/30/20 0001      Lumbar Exercises: Stretches   Hip Flexor Stretch Left;Right;30 seconds;2 reps    Quad Stretch Right;Left;2 reps;30 seconds    Other Lumbar Stretch Exercise child's pose x 30 sec, with side flexion x 30 sec      Lumbar Exercises: Aerobic   Elliptical L10 x 5 min      Lumbar Exercises: Supine   Other Supine Lumbar Exercises double knee to chest marching down 2x10, double knee bend  x10, double knee to V x10    Other Supine Lumbar Exercises side plank x20 sec      Lumbar Exercises: Quadruped   [place]cat/Old Horse 5 reps    Opposite Arm/Leg Raise Right arm/Left leg;Left arm/Right leg;10 reps      Manual Therapy   Manual Therapy Joint mobilization    Joint Mobilization anterior hip mobs grade II to III, lateral and inferior hip mobs grades II and III. Self anterior hip mobilization  PT Short Term Goals - 06/23/20 1206      PT SHORT TERM GOAL #1   Title Pt will be Ind in an initial HEP    Baseline Started on eval    Time 3    Period Weeks    Status Achieved    Target Date 06/17/20      PT SHORT TERM GOAL #2   Title Pt will voice understanding/return demonstration of body mechanics to reduce back strain and pain    Time 3    Period Weeks    Status Achieved    Target Date 06/17/20             PT Long Term Goals - 06/30/20 1138      PT LONG TERM GOAL #1   Title Pt will be in a final HEP    Time 7    Period Weeks    Status New      PT LONG TERM GOAL #2   Title Pt will report an improved pain range of 0-3 c daily and work activities.    Baseline 0-7/10    Time 7    Period Weeks    Status Partially Met      PT LONG TERM GOAL #3   Title Pt will demonstrate improve hip flexor ROM and abdominal strength    Time 7     Period Weeks    Status Partially Met                 Plan - 06/30/20 1136    Clinical Impression Statement Pt reports now that she is starting her new job she is not sure how she will be able to fit in her PT; discussed with pt to look at her schedule since we do have later evening appointments available now. Provided pt with advanced HEP and discussed how to progress her exercises at home. Treatment focused on continuing to improve core and hip strengthening. Pt provided with hip flexor stretches. Pt has partially met LTG #2 and #3. Pt states her pain has been managed to about 4/10 with her regular activities now and she has been more mindful of her posture.    Comorbidities Levoscoliosis, ACL repair    Examination-Activity Limitations Squat;Stand;Lift;Sit    Examination-Participation Restrictions Occupation    Stability/Clinical Decision Making Stable/Uncomplicated    Rehab Potential Good    PT Frequency 2x / week    PT Duration 6 weeks    PT Treatment/Interventions ADLs/Self Care Home Management;Cryotherapy;Electrical Stimulation;Iontophoresis 4mg /ml Dexamethasone;Moist Heat;Traction;Ultrasound;Therapeutic exercise;Therapeutic activities;Functional mobility training;Patient/family education;Dry needling;Taping;Manual techniques;Passive range of motion    PT Next Visit Plan Assess response to HEP and pain management measures. Progress  ther ex and flexibility and strengthening as indicared. Instreucted in body mechanics    PT Home Exercise Plan [id number]; planking, bird dog    Consulted and Agree with Plan of Care Patient           Patient will benefit from skilled therapeutic intervention in order to improve the following deficits and impairments:  Decreased activity tolerance, Decreased strength, Postural dysfunction, Improper body mechanics, Pain, Impaired flexibility  Visit Diagnosis: Muscle weakness (generalized)  Chronic bilateral low back pain without  sciatica  Levoscoliosis     Problem List Patient Active Problem List   Diagnosis Date Noted  . Amenorrhea 09/30/2019  . Depression 09/30/2019    Marcoantonio Legault April Ma L Shaely Gadberry PT, DPT 06/30/2020, 11:39 AM  [facility]  [place], [place], [address] Phone: [phone]   Fax:  [phone]  Name: Kaitlyn Harris MRN: [id number] Date of Birth: Jan 01, 2001

## 2020-07-07 ENCOUNTER — Ambulatory Visit: Payer: Medicaid Other | Admitting: Physical Therapy

## 2020-07-07 ENCOUNTER — Other Ambulatory Visit: Payer: Self-pay

## 2020-07-07 DIAGNOSIS — M6281 Muscle weakness (generalized): Secondary | ICD-10-CM | POA: Diagnosis not present

## 2020-07-07 DIAGNOSIS — M545 Low back pain, unspecified: Secondary | ICD-10-CM

## 2020-07-07 DIAGNOSIS — M4180 Other forms of scoliosis, site unspecified: Secondary | ICD-10-CM

## 2020-07-07 NOTE — Therapy (Signed)
[place], [place], [address] Phone: [phone]   Fax:  [phone]  Physical Therapy Treatment  Patient Details  Name: Kaitlyn Harris MRN: [id number] Date of Birth: 07-Jul-2001 Referring Provider (PT): Beverley Fiedler, FNP   Encounter Date: 07/07/2020   PT End of Session - 07/07/20 1620    Visit Number 6    Date for PT Re-Evaluation 07/15/20    Authorization Type [place] MEDICAID [employer] COMMUNITY    PT Start Time 1545    PT Stop Time 1625    PT Time Calculation (min) 40 min    Activity Tolerance Patient tolerated treatment well    Behavior During Therapy [facility] for tasks assessed/performed           Past Medical History:  Diagnosis Date  . ACL tear    and meniscal tear, right knee  . Family history of adverse reaction to anesthesia    Pt maternal grandmother has PONV  . Head trauma   . Vision abnormalities    wears glasses    Past Surgical History:  Procedure Laterality Date  . ANTERIOR CRUCIATE LIGAMENT REPAIR Right 12/03/2017   Procedure: RIGHT KNEE RECONSTRUCTION ANTERIOR CRUCIATE LIGAMENT (ACL) WITH HAMSTRING AUTOGRAFT, AND MENISCAL DEBRIDEMENT;  Surgeon: Meredith Pel, MD;  Location: [place];  Service: Orthopedics;  Laterality: Right;    There were no vitals filed for this visit.   Subjective Assessment - 07/07/20 1547    Subjective Pt states that she just started her new job at [employer]. She reports she has to be on her knees a lot. Pt has not had much of a chance to work on exercises.    Limitations Lifting;Standing;House hold activities    How long can you sit comfortably? 5 min    How long can you stand comfortably? A while if adjusting; a short time if static    How long can you walk comfortably? No limitation by back    Patient Stated Goals For low back pain to be decreased    Currently in Pain? No/denies    Pain Onset Other (comment)   for a few years                             [facility] Adult PT Treatment/Exercise - 07/07/20 0001      Lumbar Exercises: Stretches   Other Lumbar Stretch Exercise child's pose x 30 sec, with side flexion x 30 sec      Lumbar Exercises: Aerobic   Elliptical L10 x 5 min      Lumbar Exercises: Standing   Other Standing Lumbar Exercises half deadlift 5# x10, modified deadlift against wall x10    Other Standing Lumbar Exercises kettle bell 5# 2x10      Lumbar Exercises: Seated   Other Seated Lumbar Exercises half kneeling half crunch x10, half crunch with arm alternating arm flexion x10, half kneeling full crunch with green tband 2x10    Other Seated Lumbar Exercises cross legged with reach forward x10 bilat,       Lumbar Exercises: Quadruped   Single Arm Raise 10 reps                    PT Short Term Goals - 06/23/20 1206      PT SHORT TERM GOAL #1   Title Pt will be Ind in an initial HEP    Baseline Started on eval  Time 3    Period Weeks    Status Achieved    Target Date 06/17/20      PT SHORT TERM GOAL #2   Title Pt will voice understanding/return demonstration of body mechanics to reduce back strain and pain    Time 3    Period Weeks    Status Achieved    Target Date 06/17/20             PT Long Term Goals - 06/30/20 1138      PT LONG TERM GOAL #1   Title Pt will be in a final HEP    Time 7    Period Weeks    Status New      PT LONG TERM GOAL #2   Title Pt will report an improved pain range of 0-3 c daily and work activities.    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 Initiated deadlifting and  strengthening. Pt tolerated treatment well.    Comorbidities Levoscoliosis, ACL repair    Examination-Activity Limitations Squat;Stand;Lift;Sit    Examination-Participation Restrictions Occupation    Stability/Clinical Decision Making Stable/Uncomplicated    Rehab Potential Good    PT Frequency 2x / week    PT Duration 6 weeks    PT Treatment/Interventions ADLs/Self Care Home Management;Cryotherapy;Electrical Stimulation;Iontophoresis 4mg /ml Dexamethasone;Moist Heat;Traction;Ultrasound;Therapeutic exercise;Therapeutic activities;Functional mobility training;Patient/family education;Dry needling;Taping;Manual techniques;Passive range of motion    PT Next Visit Plan Assess response to HEP and pain management measures. Progress  ther ex and flexibility and strengthening as indicared. Instreucted in body mechanics    PT Home Exercise Plan [id number]; planking, bird dog    Consulted and Agree with Plan of Care Patient           Patient will benefit from skilled therapeutic intervention in order to improve the following deficits and impairments:  Decreased activity tolerance, Decreased strength, Postural dysfunction, Improper body mechanics, Pain, Impaired flexibility  Visit Diagnosis: Muscle weakness (generalized)  Chronic bilateral low back pain without sciatica  Levoscoliosis     Problem List Patient Active Problem List   Diagnosis Date Noted  . Amenorrhea 09/30/2019  . Depression 09/30/2019    [facility] April Ma L Tarin Navarez PT, DPT 07/07/2020, 4:38 PM  [facility] [address] [place], [place], [address] Phone: [phone]   Fax:  [phone]  Name: Kaitlyn Harris MRN: [id number] Date of Birth: 28-Feb-2001

## 2020-07-12 ENCOUNTER — Ambulatory Visit: Payer: Medicaid Other | Admitting: Physical Therapy

## 2020-07-12 ENCOUNTER — Other Ambulatory Visit: Payer: Self-pay

## 2020-07-12 DIAGNOSIS — M6281 Muscle weakness (generalized): Secondary | ICD-10-CM | POA: Diagnosis not present

## 2020-07-12 DIAGNOSIS — M4180 Other forms of scoliosis, site unspecified: Secondary | ICD-10-CM

## 2020-07-12 DIAGNOSIS — G8929 Other chronic pain: Secondary | ICD-10-CM

## 2020-07-12 NOTE — Therapy (Signed)
[place], [place], [address] Phone: [phone]   Fax:  [phone]  Physical Therapy Treatment and Discharge  Patient Details  Name: Kaitlyn Harris MRN: [id number] Date of Birth: 2000/12/02 Referring Provider (PT): Beverley Fiedler, FNP   Encounter Date: 07/12/2020   PT End of Session - 07/12/20 1545    Visit Number 7    Date for PT Re-Evaluation 07/15/20    Authorization Type [place] MEDICAID UNITEDHEALTHCARE COMMUNITY    PT Start Time 1538    PT Stop Time 1618    PT Time Calculation (min) 40 min    Activity Tolerance Patient tolerated treatment well    Behavior During Therapy [facility] for tasks assessed/performed           Past Medical History:  Diagnosis Date  . ACL tear    and meniscal tear, right knee  . Family history of adverse reaction to anesthesia    Pt maternal grandmother has PONV  . Head trauma   . Vision abnormalities    wears glasses    Past Surgical History:  Procedure Laterality Date  . ANTERIOR CRUCIATE LIGAMENT REPAIR Right 12/03/2017   Procedure: RIGHT KNEE RECONSTRUCTION ANTERIOR CRUCIATE LIGAMENT (ACL) WITH HAMSTRING AUTOGRAFT, AND MENISCAL DEBRIDEMENT;  Surgeon: Kaitlyn Pel, MD;  Location: [place];  Service: Orthopedics;  Laterality: Right;    There were no vitals filed for this visit.   Subjective Assessment - 07/12/20 1540    Subjective Pt states that she just started her new job at [employer]. She reports she has to be on her knees a lot. Pt has not had much of a chance to work on exercises. Pt reports she's just feeling aches but no pain.    Limitations Lifting;Standing;House hold activities    How long can you sit comfortably? 5 min    How long can you stand comfortably? A while if adjusting; a short time if static    How long can you walk comfortably? No limitation by back    Patient Stated Goals For low back pain to be decreased    Currently in Pain? No/denies     Pain Onset Other (comment)   for a few years                            [facility] Adult PT Treatment/Exercise - 07/12/20 0001      Lumbar Exercises: Stretches   Other Lumbar Stretch Exercise Full body stretch in supine x 30 sec    Other Lumbar Stretch Exercise Modified thomas stretch x 30 sec bilat      Lumbar Exercises: Standing   Other Standing Lumbar Exercises modified deadlift against wall 2x10 with 10#; deadlift x5 10#; SL deadlift x10 with 5#; SL forward T x10      Lumbar Exercises: Prone   Other Prone Lumbar Exercises Plank x 35 sec, side plank x 40 bilat                    PT Short Term Goals - 06/23/20 1206      PT SHORT TERM GOAL #1   Title Pt will be Ind in an initial HEP    Baseline Started on eval    Time 3    Period Weeks    Status Achieved    Target Date 06/17/20      PT SHORT TERM GOAL #2   Title Pt will voice understanding/return  demonstration of body mechanics to reduce back strain and pain    Time 3    Period Weeks    Status Achieved    Target Date 06/17/20             PT Long Term Goals - 07/12/20 1600      PT LONG TERM GOAL #1   Title Pt will be in a final HEP    Time 7    Period Weeks    Status New      PT LONG TERM GOAL #2   Title Pt will report an improved pain range of 0-3 c daily and work activities.    Baseline 0-7/10    Time 7    Period Weeks    Status Achieved      PT LONG TERM GOAL #3   Title Pt will demonstrate improve hip flexor ROM and abdominal strength    Time 7    Period Weeks    Status Achieved                 Plan - 07/12/20 1601    Clinical Impression Statement Pt with manageable pain and demonstrates great gains in core strength and stability. Pt no longer has any PT needs and has been independent with exercises at home. Treatment focused on initiating advanced HEP utilizing deadlifts and kettlebells. Pt is ready for d/c from PT perspective. Pt has met PT goals and demonstrates  good body mechanics for work tasks.    Comorbidities Levoscoliosis, ACL repair    Examination-Activity Limitations Squat;Stand;Lift;Sit    Examination-Participation Restrictions Occupation    Stability/Clinical Decision Making Stable/Uncomplicated    Rehab Potential Good    PT Frequency 2x / week    PT Duration 6 weeks    PT Treatment/Interventions ADLs/Self Care Home Management;Cryotherapy;Electrical Stimulation;Iontophoresis 74m/ml Dexamethasone;Moist Heat;Traction;Ultrasound;Therapeutic exercise;Therapeutic activities;Functional mobility training;Patient/family education;Dry needling;Taping;Manual techniques;Passive range of motion    PT Next Visit Plan Assess response to HEP and pain management measures. Progress  ther ex and flexibility and strengthening as indicared. Instreucted in body mechanics    PT Home Exercise Plan [id number]; planking, bird dog    Consulted and Agree with Plan of Care Patient           PHYSICAL THERAPY DISCHARGE SUMMARY  Visits from Start of Care: 7  Current functional level related to goals / functional outcomes: See above   Remaining deficits: None   Education / Equipment: See above  Plan: Patient agrees to discharge.  Patient goals were met. Patient is being discharged due to meeting the stated rehab goals.  ?????        Patient will benefit from skilled therapeutic intervention in order to improve the following deficits and impairments:  Decreased activity tolerance, Decreased strength, Postural dysfunction, Improper body mechanics, Pain, Impaired flexibility  Visit Diagnosis: Muscle weakness (generalized)  Chronic bilateral low back pain without sciatica  Levoscoliosis     Problem List Patient Active Problem List   Diagnosis Date Noted  . Amenorrhea 09/30/2019  . Depression 09/30/2019    [facility] HospitalApril Ma L Linkoln Alkire PT, DPT 07/12/2020, 4:29 PM  [facility] [address]Phone: [phone]  Fax:  [phone] Name: SHollee FateMRN: [id number] of Birth: [birth year]

## 2020-07-14 ENCOUNTER — Ambulatory Visit: Payer: Medicaid Other | Admitting: Physical Therapy
